# Patient Record
Sex: Male | Born: 1960 | Race: Asian | Hispanic: No | Marital: Married | Smoking: Never smoker
Health system: Southern US, Community
[De-identification: ages and names within clinical notes are randomized; demographics above are authoritative.]

## PROBLEM LIST (undated history)

## (undated) DIAGNOSIS — N200 Calculus of kidney: Secondary | ICD-10-CM

## (undated) DIAGNOSIS — K8020 Calculus of gallbladder without cholecystitis without obstruction: Secondary | ICD-10-CM

## (undated) HISTORY — DX: Calculus of kidney: N20.0

## (undated) HISTORY — DX: Calculus of gallbladder without cholecystitis without obstruction: K80.20

---

## 2008-04-14 ENCOUNTER — Ambulatory Visit: Payer: Self-pay | Admitting: Gastroenterology

## 2008-04-14 DIAGNOSIS — Z87442 Personal history of urinary calculi: Secondary | ICD-10-CM | POA: Insufficient documentation

## 2008-04-14 DIAGNOSIS — Z85038 Personal history of other malignant neoplasm of large intestine: Secondary | ICD-10-CM | POA: Insufficient documentation

## 2008-04-14 DIAGNOSIS — R1011 Right upper quadrant pain: Secondary | ICD-10-CM | POA: Insufficient documentation

## 2008-04-14 DIAGNOSIS — R1013 Epigastric pain: Secondary | ICD-10-CM | POA: Insufficient documentation

## 2008-04-14 LAB — CONVERTED CEMR LAB
Albumin: 4.6 g/dL (ref 3.5–5.2)
Alkaline Phosphatase: 53 units/L (ref 39–117)
BUN: 14 mg/dL (ref 6–23)
CO2: 30 meq/L (ref 19–32)
Eosinophils Relative: 2.5 % (ref 0.0–5.0)
GFR calc Af Amer: 104 mL/min
GFR calc non Af Amer: 86 mL/min
Glucose, Bld: 101 mg/dL — ABNORMAL HIGH (ref 70–99)
H Pylori IgG: NEGATIVE
HCT: 40.4 % (ref 39.0–52.0)
Hemoglobin: 14.5 g/dL (ref 13.0–17.0)
Monocytes Absolute: 0.4 10*3/uL (ref 0.1–1.0)
Monocytes Relative: 7.1 % (ref 3.0–12.0)
Neutro Abs: 3.1 10*3/uL (ref 1.4–7.7)
RBC: 4.64 M/uL (ref 4.22–5.81)
Total Bilirubin: 0.9 mg/dL (ref 0.3–1.2)
WBC: 5.2 10*3/uL (ref 4.5–10.5)

## 2008-04-16 ENCOUNTER — Telehealth (INDEPENDENT_AMBULATORY_CARE_PROVIDER_SITE_OTHER): Payer: Self-pay

## 2008-04-20 ENCOUNTER — Ambulatory Visit (HOSPITAL_COMMUNITY): Admission: RE | Admit: 2008-04-20 | Discharge: 2008-04-20 | Payer: Self-pay | Admitting: Gastroenterology

## 2008-04-20 ENCOUNTER — Telehealth: Payer: Self-pay | Admitting: Internal Medicine

## 2008-04-20 ENCOUNTER — Telehealth (INDEPENDENT_AMBULATORY_CARE_PROVIDER_SITE_OTHER): Payer: Self-pay

## 2008-04-30 ENCOUNTER — Ambulatory Visit (HOSPITAL_COMMUNITY): Admission: RE | Admit: 2008-04-30 | Discharge: 2008-04-30 | Payer: Self-pay | Admitting: Internal Medicine

## 2008-06-06 ENCOUNTER — Emergency Department (HOSPITAL_BASED_OUTPATIENT_CLINIC_OR_DEPARTMENT_OTHER): Admission: EM | Admit: 2008-06-06 | Discharge: 2008-06-06 | Payer: Self-pay | Admitting: Emergency Medicine

## 2008-06-10 DIAGNOSIS — K8020 Calculus of gallbladder without cholecystitis without obstruction: Secondary | ICD-10-CM | POA: Insufficient documentation

## 2008-06-11 ENCOUNTER — Ambulatory Visit: Payer: Self-pay | Admitting: Internal Medicine

## 2009-09-02 ENCOUNTER — Ambulatory Visit (HOSPITAL_COMMUNITY): Admission: RE | Admit: 2009-09-02 | Discharge: 2009-09-02 | Payer: Self-pay | Admitting: Urology

## 2009-09-06 ENCOUNTER — Ambulatory Visit (HOSPITAL_COMMUNITY): Admission: RE | Admit: 2009-09-06 | Discharge: 2009-09-06 | Payer: Self-pay | Admitting: Urology

## 2009-10-16 HISTORY — PX: LITHOTRIPSY: SUR834

## 2012-09-24 ENCOUNTER — Ambulatory Visit (INDEPENDENT_AMBULATORY_CARE_PROVIDER_SITE_OTHER): Payer: Self-pay | Admitting: General Surgery

## 2012-10-02 ENCOUNTER — Encounter (INDEPENDENT_AMBULATORY_CARE_PROVIDER_SITE_OTHER): Payer: Self-pay | Admitting: General Surgery

## 2012-10-07 ENCOUNTER — Ambulatory Visit (INDEPENDENT_AMBULATORY_CARE_PROVIDER_SITE_OTHER): Payer: Private Health Insurance - Indemnity | Admitting: General Surgery

## 2012-10-07 ENCOUNTER — Encounter (INDEPENDENT_AMBULATORY_CARE_PROVIDER_SITE_OTHER): Payer: Self-pay | Admitting: General Surgery

## 2012-10-07 VITALS — BP 128/88 | HR 68 | Temp 97.6°F | Resp 16 | Ht 68.0 in | Wt 173.0 lb

## 2012-10-07 DIAGNOSIS — K8020 Calculus of gallbladder without cholecystitis without obstruction: Secondary | ICD-10-CM

## 2012-10-07 NOTE — Patient Instructions (Signed)
Call if you develop symptoms.

## 2012-10-15 NOTE — Progress Notes (Signed)
Subjective:     Patient ID: Nicholas Massey, male   DOB: [birth year], 51 y.o.   MRN: [id number]  HPI We are asked to see the patient in consultation by Dr. Lenise Arena to evaluate him for gallstones. The patient is a 51 year old [place] gentleman who recently went to [place] and had an ultrasound done. The ultrasound which we do not have the records for he states showed a gallbladder full stones. The only records we can find include a study from several years ago that did show some gallstones. The patient states that he has no abnormal pain. He denies any nausea or vomiting. He appears to be completely asymptomatic at this point. It is not clear why he got an ultrasound in [place].  Review of Systems  Constitutional: Negative.   HENT: Negative.   Eyes: Negative.   Respiratory: Negative.   Cardiovascular: Negative.   Gastrointestinal: Negative.   Genitourinary: Negative.   Musculoskeletal: Negative.   Skin: Negative.   Neurological: Negative.   Hematological: Negative.   Psychiatric/Behavioral: Negative.        Objective:   Physical Exam  Constitutional: He is oriented to person, place, and time. He appears well-developed and well-nourished.  HENT:  Head: Normocephalic and atraumatic.  Eyes: Conjunctivae normal and EOM are normal. Pupils are equal, round, and reactive to light.  Neck: Normal range of motion. Neck supple.  Cardiovascular: Normal rate, regular rhythm and normal heart sounds.   Pulmonary/Chest: Effort normal and breath sounds normal.  Abdominal: Soft. Bowel sounds are normal. He exhibits no mass. There is no tenderness.  Musculoskeletal: Normal range of motion.  Neurological: He is alert and oriented to person, place, and time.  Skin: Skin is warm and dry.  Psychiatric: He has a normal mood and affect. His behavior is normal.       Assessment:     The patient appears to have gallstones but is asymptomatic. I certainly question whether he has had some symptoms since he did get an  ultrasound but he denies any abdominal pain or nausea and vomiting. I have talked to him in detail about the possibility of removing the gallbladder and the risks and benefits associated with it. At this point I think the likelihood of him having a problem from the gallstones is fairly high. He would like to wait until he begins to have symptoms before considering surgery.    Plan:     The patient will call us immediately if he develops any symptoms consistent with biliary colic

## 2016-05-03 ENCOUNTER — Other Ambulatory Visit: Payer: Self-pay | Admitting: Urology

## 2016-05-09 ENCOUNTER — Encounter (HOSPITAL_COMMUNITY): Payer: Self-pay | Admitting: *Deleted

## 2016-05-11 ENCOUNTER — Encounter (HOSPITAL_COMMUNITY)
Admission: RE | Disposition: A | Discharge status: Home / Self Care | Payer: Self-pay | Source: Ambulatory Visit | Attending: Urology

## 2016-05-11 ENCOUNTER — Ambulatory Visit (HOSPITAL_COMMUNITY)
Admission: RE | Admit: 2016-05-11 | Discharge: 2016-05-11 | Disposition: A | Discharge status: Home / Self Care | Payer: 59 | Source: Ambulatory Visit | Attending: Urology | Admitting: Urology

## 2016-05-11 ENCOUNTER — Ambulatory Visit (HOSPITAL_COMMUNITY): Payer: 59

## 2016-05-11 DIAGNOSIS — I10 Essential (primary) hypertension: Secondary | ICD-10-CM | POA: Diagnosis not present

## 2016-05-11 DIAGNOSIS — N132 Hydronephrosis with renal and ureteral calculous obstruction: Secondary | ICD-10-CM | POA: Diagnosis not present

## 2016-05-11 DIAGNOSIS — N201 Calculus of ureter: Secondary | ICD-10-CM

## 2016-05-11 DIAGNOSIS — N200 Calculus of kidney: Secondary | ICD-10-CM | POA: Diagnosis present

## 2016-05-11 SURGERY — LITHOTRIPSY, ESWL
Anesthesia: LOCAL | Laterality: Right

## 2016-05-11 MED ORDER — SODIUM CHLORIDE 0.9 % IV SOLN
INTRAVENOUS | Status: DC
  Administered 2016-05-11: 10:00:00 via INTRAVENOUS

## 2016-05-11 MED ORDER — DIAZEPAM 5 MG PO TABS
10.0000 mg | ORAL_TABLET | ORAL | Status: AC
  Administered 2016-05-11: 10 mg via ORAL
  Filled 2016-05-11: qty 2

## 2016-05-11 MED ORDER — CIPROFLOXACIN HCL 500 MG PO TABS
500.0000 mg | ORAL_TABLET | ORAL | Status: AC
  Administered 2016-05-11: 500 mg via ORAL
  Filled 2016-05-11: qty 1

## 2016-05-11 MED ORDER — DIPHENHYDRAMINE HCL 25 MG PO CAPS
25.0000 mg | ORAL_CAPSULE | ORAL | Status: AC
  Administered 2016-05-11: 25 mg via ORAL
  Filled 2016-05-11: qty 1

## 2016-05-11 NOTE — H&P (Signed)
Office Visit Report     05/01/2016   --------------------------------------------------------------------------------   Nicholas Massey  MRN: [id number]  PRIMARY CARE:  Alanson Aly. Lenise Arena, MD  DOB: 18-May-1961, 55 year old Male  REFERRING:    SSN: [id number]  PROVIDER:  Jethro Bolus, M.D.    LOCATION:  [facility] [phone]   --------------------------------------------------------------------------------   CC: I have kidney stones.  HPI: Nicholas Massey is a 55 year-old male established patient who is here for renal calculi.  He first stated noticing pain on 11/02/2015. This is not his first kidney stone. His first stone was approximately 10/17/2015. He has had 1 stones prior to getting this one. He is not currently having flank pain, back pain, groin pain, nausea, vomiting, fever or chills. He has not caught a stone in his urine strainer since his symptoms began.   He has had eswl for treatment of his stones in the past.   F/u to review CT results.     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 Calculus Ureter, Calculus of right ureter - 11/05/2015 Kidney Stone, Bilateral kidney stones - 11/05/2015, Kidney stone on right side, - 11/05/2015, Kidney stone on left side, - 05/27/2014 Asymptomatic microscopic hematuria, Asymptomatic microscopic hematuria - 09/14/2015 Other microscopic hematuria, Microscopic hematuria - 05/27/2014 Encounter for Prostate Cancer screening, Prostate cancer screening - 2014 Flank Pain, Generalized abdominal pain - 2014 Hematospermia, Hematospermia - 2014 Inflammatory Disease Prostate, Unspec, Prostatitis - 2014 Personal Hx urinary calculi, Nephrolithiasis - 2014    NON-GU PMH: Encounter for general adult medical examination without abnormal findings, Encounter for preventive health examination - 09/14/2015     FAMILY HISTORY: Family Health Status - Father's Age - Runs In Family Family Health Status - Mother's Age - Runs In Family Family Health Status Number - Runs In Family nephrolithiasis - Father   SOCIAL HISTORY: Marital Status: Married Current Smoking Status: Patient has never smoked.  Has never drank.  Patient's occupation Occupational psychologist.    REVIEW OF SYSTEMS:    GU Review Male:   Patient denies penile pain, get up at night to urinate, trouble starting your stream, hard to postpone urination, have to strain to urinate , leakage of urine, stream starts and stops, frequent urination, erection problems, and burning/ pain with urination.  Gastrointestinal (Upper):   Patient denies nausea, vomiting, and indigestion/ heartburn.  Gastrointestinal (Lower):   Patient denies diarrhea and constipation.  Constitutional:   Patient denies fever, night sweats, weight loss, and fatigue.  Skin:   Patient denies skin rash/ lesion and itching.  Eyes:   Patient denies blurred vision and double vision.  Ears/ Nose/ Throat:   Patient denies sore throat and sinus problems.  Hematologic/Lymphatic:   Patient denies swollen glands and easy bruising.  Cardiovascular:   Patient denies leg swelling and chest pains.  Respiratory:   Patient denies cough and shortness of breath.  Endocrine:   Patient denies excessive thirst.  Musculoskeletal:   Patient denies back pain and joint pain.  Neurological:   Patient denies headaches and dizziness.  Psychologic:   Patient denies depression and anxiety.   VITAL SIGNS:      05/01/2016 08:25 AM  BP 160/98 mmHg  Pulse 76 /min  Temperature 97.7 F / 36 C   PAST DATA REVIEWED:  Source Of History:  Patient  X-Ray Review: C.T. Abdomen/Pelvis: Reviewed Report. Discussed With Patient.     05/27/14 05/09/12 08/06/09  PSA  Total PSA 0.39  0.37  0.41     PROCEDURES: None   ASSESSMENT:      ICD-10 Details  1 GU:   Calculus Kidney and Ureter - N20.2   2    Other microscopic hematuria - R31.29   3   Kidney Stone - N20.0   4   Hydronephrosis with renal and ureteral calculous obstruction - N13.2   5   Kidney Stone - N20.0   6   Calculus Ureter - N20.1               Notes:   55 yo male, reporting gross hematuria in January. However, note from NP in January shows that the pt denied hematuria, but he he had NP visit in November showing LLP ( stable) in November , as well as Right-sided Lower pole stone, and 5mm Right mid-ureter stone, with gross hematuria. He elected to be followed, and has remained asymptomatic, but his CT now shows that the stone has grown to 9mm, and is in the Right lower ureter, with mild hydronephrosis. Even though he remains asymptomatic, I think he should have removal of the stone, because of it's increase in size, and because of its failure to progress to pass. He has mild hydronephrosis also. We have disussed his options for stone removal, including both lithotripsy and cysto with laser fractionation of the stone. He has had successful lithotripsy before, and sould like to proceed with lithotripsy again, understanding that he can have cysto and laser of stone as a 2ndary surgery if necessary. We have discussed possible complications of both surgical approaches, including bleeding and strictures; as well as my concerns of leaving a large stone in the lower ureter to " grow into" the wall of the ureter with permanent hydronephrosis.  He needs Litholink in the future.     PLAN:           Schedule Return Visit: Next Available Appointment - Schedule Surgery          Document Letter(s):  Created for Patient: Clinical Summary    Signed by Jethro Bolus, M.D. on 05/01/16 at 9:03 AM (EDT)     The information contained in this medical record document is considered private and confidential patient information. This information can only be used for the medical diagnosis and/or medical services that are being provided by the  patient's selected caregivers. This information can only be distributed outside of the patient's care if the patient agrees and signs waivers of authorization for this information to be sent to an outside source or route.

## 2016-05-11 NOTE — Discharge Instructions (Addendum)
Moderate Conscious Sedation, Adult, Care After °Refer to this sheet in the next few weeks. These instructions provide you with information on caring for yourself after your procedure. Your health care provider may also give you more specific instructions. Your treatment has been planned according to current medical practices, but problems sometimes occur. Call your health care provider if you have any problems or questions after your procedure. °WHAT TO EXPECT AFTER THE PROCEDURE  °After your procedure: °You may feel sleepy, clumsy, and have poor balance for several hours. °Vomiting may occur if you eat too soon after the procedure. °HOME CARE INSTRUCTIONS °Do not participate in any activities where you could become injured for at least 24 hours. Do not: °Drive. °Swim. °Ride a bicycle. °Operate heavy machinery. °Cook. °Use power tools. °Climb ladders. °Work from a high place. °Do not make important decisions or sign legal documents until you are improved. °If you vomit, drink water, juice, or soup when you can drink without vomiting. Make sure you have little or no nausea before eating solid foods. °Only take over-the-counter or prescription medicines for pain, discomfort, or fever as directed by your health care provider. °Make sure you and your family fully understand everything about the medicines given to you, including what side effects may occur. °You should not drink alcohol, take sleeping pills, or take medicines that cause drowsiness for at least 24 hours. °If you smoke, do not smoke without supervision. °If you are feeling better, you may resume normal activities 24 hours after you were sedated. °Keep all appointments with your health care provider. °SEEK MEDICAL CARE IF: °Your skin is pale or bluish in color. °You continue to feel nauseous or vomit. °Your pain is getting worse and is not helped by medicine. °You have bleeding or swelling. °You are still sleepy or feeling clumsy after 24 hours. °SEEK  IMMEDIATE MEDICAL CARE IF: °You develop a rash. °You have difficulty breathing. °You develop any type of allergic problem. °You have a fever. °MAKE SURE YOU: °Understand these instructions. °Will watch your condition. °Will get help right away if you are not doing well or get worse. °  °This information is not intended to replace advice given to you by your health care provider. Make sure you discuss any questions you have with your health care provider. °  °Document Released: 07/23/2013 Document Revised: 10/23/2014 Document Reviewed: 07/23/2013 °Elsevier Interactive Patient Education ©2016 [employer] °Dietary Guidelines to Help Prevent Kidney Stones °Your risk of kidney stones can be decreased by adjusting the foods you eat. The most important thing you can do is drink enough fluid. You should drink enough fluid to keep your urine clear or pale yellow. The following guidelines provide specific information for the type of kidney stone you have had. °GUIDELINES ACCORDING TO TYPE OF KIDNEY STONE °Calcium Oxalate Kidney Stones °· Reduce the amount of salt you eat. Foods that have a lot of salt cause your body to release excess calcium into your urine. The excess calcium can combine with a substance called oxalate to form kidney stones. °· Reduce the amount of animal protein you eat if the amount you eat is excessive. Animal protein causes your body to release excess calcium into your urine. Ask your dietitian how much protein from animal sources you should be eating. °· Avoid foods that are high in oxalates. If you take vitamins, they should have less than 500 mg of vitamin C. Your body turns vitamin C into oxalates. You do not need to avoid fruits   and vegetables high in vitamin C. °Calcium Phosphate Kidney Stones °· Reduce the amount of salt you eat to help prevent the release of excess calcium into your urine. °· Reduce the amount of animal protein you eat if the amount you eat is excessive. Animal protein causes  your body to release excess calcium into your urine. Ask your dietitian how much protein from animal sources you should be eating. °· Get enough calcium from food or take a calcium supplement (ask your dietitian for recommendations). Food sources of calcium that do not increase your risk of kidney stones include: °¨ Broccoli. °¨ Dairy products, such as cheese and yogurt. °¨ Pudding. °Uric Acid Kidney Stones °· Do not have more than 6 oz of animal protein per day. °FOOD SOURCES °Animal Protein Sources °· Meat (all types). °· Poultry. °· Eggs. °· Fish, seafood. °Foods High in Salt °· Salt seasonings. °· Soy sauce. °· Teriyaki sauce. °· Cured and processed meats. °· Salted crackers and snack foods. °· Fast food. °· Canned soups and most canned foods. °Foods High in Oxalates °· Grains: °¨ Amaranth. °¨ Barley. °¨ Grits. °¨ Wheat germ. °¨ Bran. °¨ Buckwheat flour. °¨ All bran cereals. °¨ Pretzels. °¨ Whole wheat bread. °· Vegetables: °¨ Beans (wax). °¨ Beets and beet greens. °¨ Collard greens. °¨ Eggplant. °¨ Escarole. °¨ Leeks. °¨ Okra. °¨ Parsley. °¨ Rutabagas. °¨ Spinach. °¨ Swiss chard. °¨ Tomato paste. °¨ Fried potatoes. °¨ Sweet potatoes. °· Fruits: °¨ Red currants. °¨ Figs. °¨ Kiwi. °¨ Rhubarb. °· Meat and Other Protein Sources: °¨ Beans (dried). °¨ Soy burgers and other soybean products. °¨ Miso. °¨ Nuts (peanuts, almonds, pecans, cashews, hazelnuts). °¨ Nut butters. °¨ Sesame seeds and tahini (paste made of sesame seeds). °¨ Poppy seeds. °· Beverages: °¨ Chocolate drink mixes. °¨ Soy milk. °¨ Instant iced tea. °¨ Juices made from high-oxalate fruits or vegetables. °· Other: °¨ Carob. °¨ Chocolate. °¨ Fruitcake. °¨ Marmalades. °  °This information is not intended to replace advice given to you by your health care provider. Make sure you discuss any questions you have with your health care provider. °  °Document Released: 01/27/2011 Document Revised: 10/07/2013 Document Reviewed: 08/29/2013 °Elsevier Interactive  Patient Education ©2016 Else[employer]. ° °

## 2016-05-11 NOTE — Interval H&P Note (Signed)
History and Physical Interval Note:  05/11/2016 10:05 AM  Nicholas Massey  has presented today for surgery, with the diagnosis of RIGHT URETERAL STONE  The various methods of treatment have been discussed with the patient and family. After consideration of risks, benefits and other options for treatment, the patient has consented to  Procedure(s): RIGHT EXTRACORPOREAL SHOCK WAVE LITHOTRIPSY (ESWL) (Right) as a surgical intervention .  The patient's history has been reviewed, patient examined, no change in status, stable for surgery.  I have reviewed the patient's chart and labs.  Questions were answered to the patient's satisfaction.     Lynnelle Mesmer I Behr Cislo

## 2017-01-19 IMAGING — CR DG ABDOMEN 1V
2 series · 2 of 2 positions shown · non-contrast
Comparison: CT 04/28/2016.

CLINICAL DATA: Lithotripsy evaluation for right-sided stone.

EXAM:
ABDOMEN - 1 VIEW

[t abdomen supine (1 of 2)]
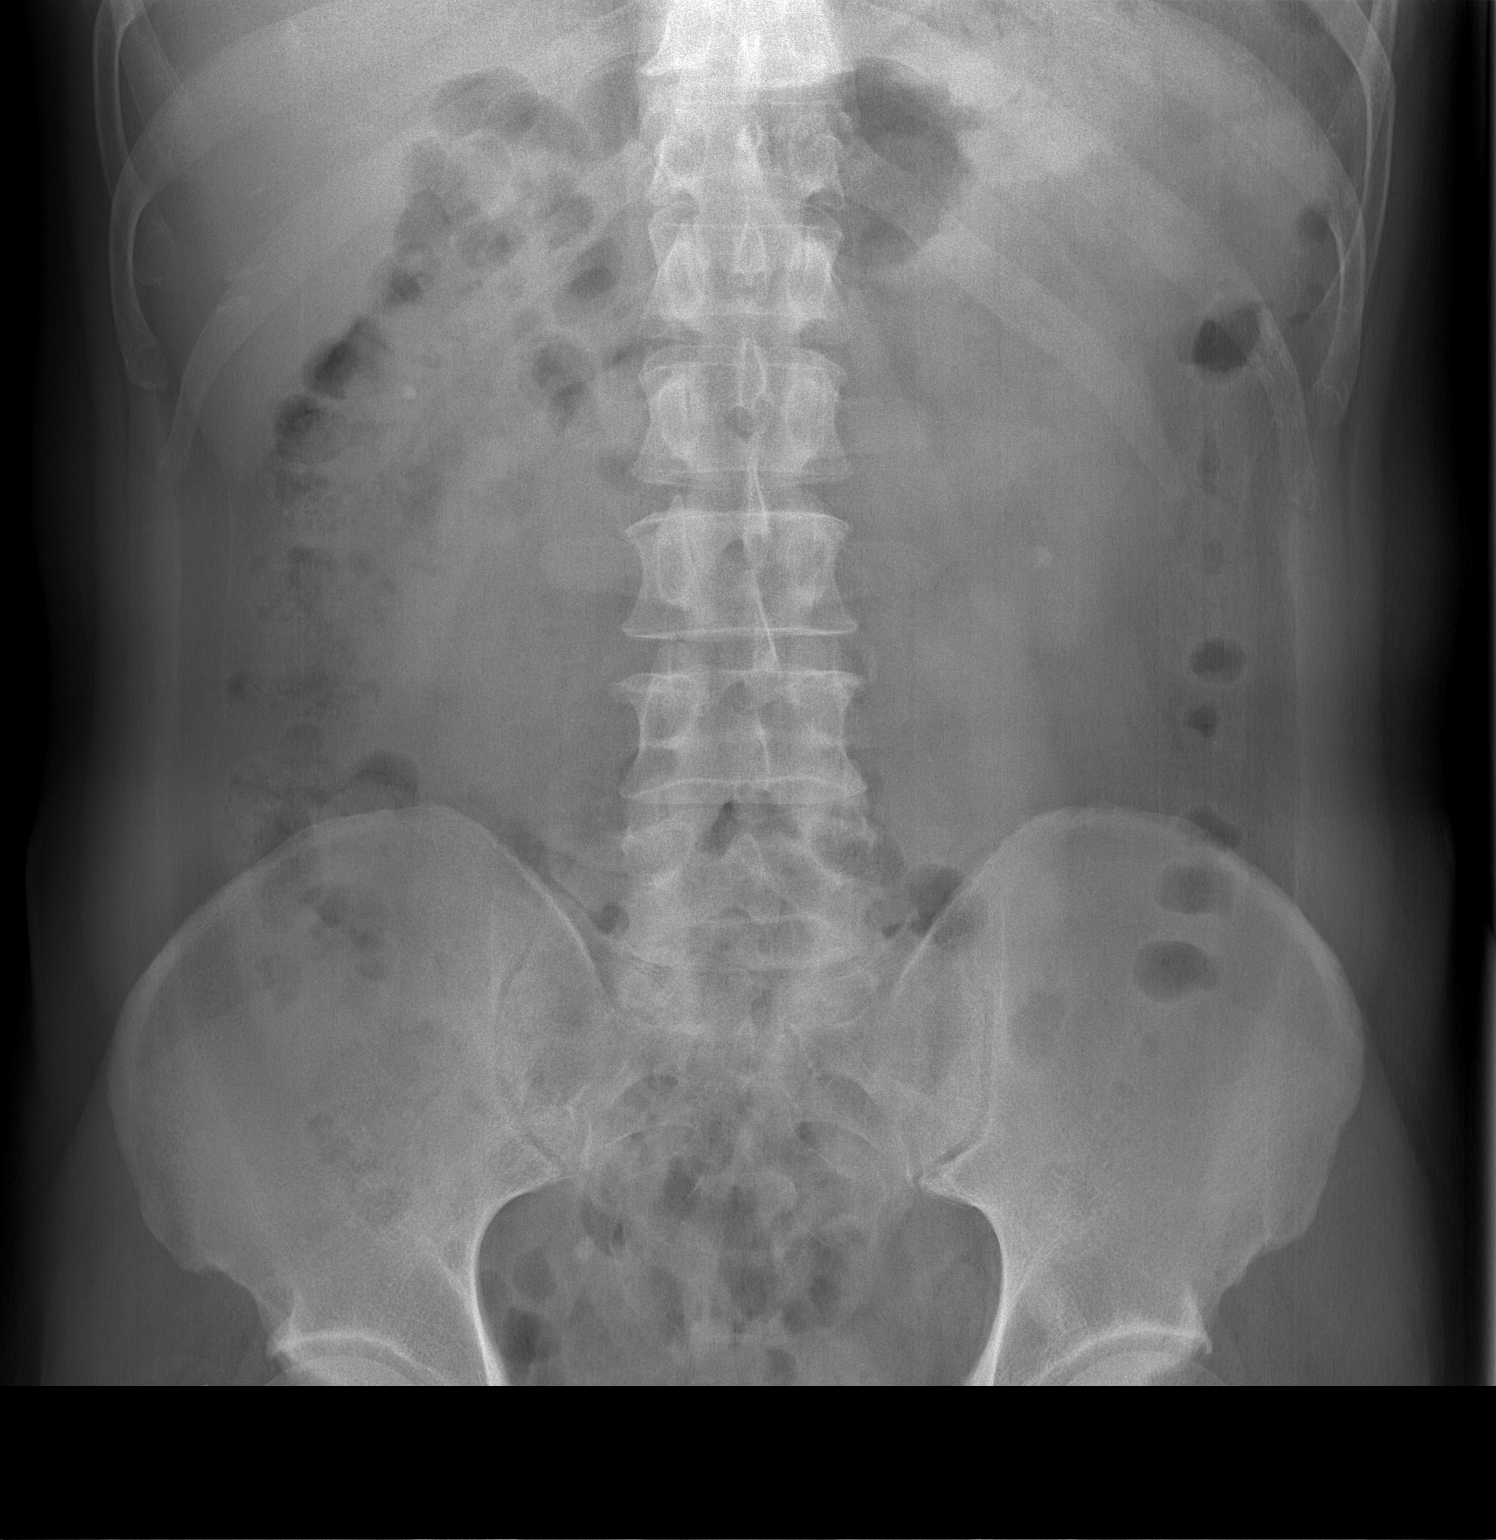

[t abdomen supine (2 of 2)]
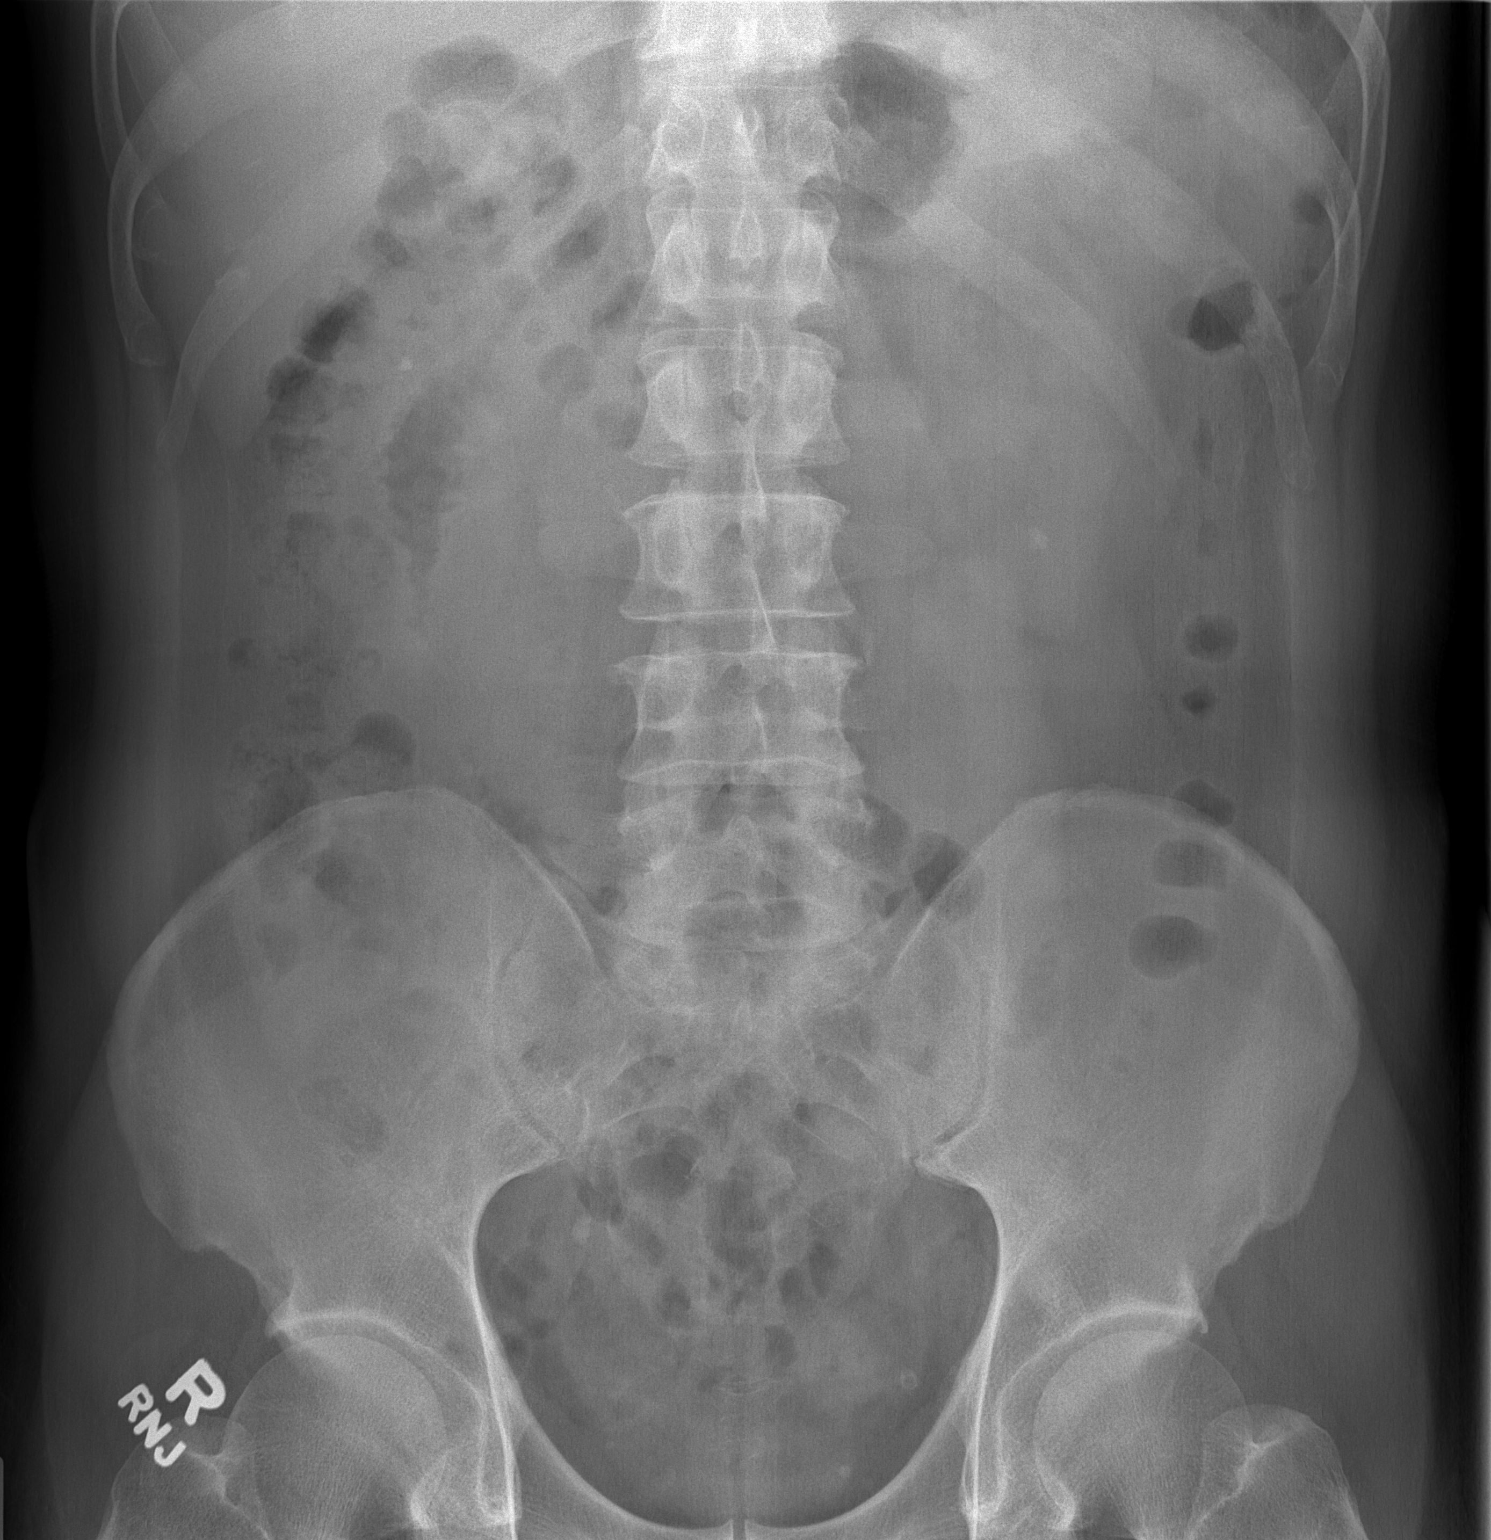

[2 of 2 positions shown; findings below may reference images not displayed]

FINDINGS: 4 mm stone remains evident overlying the midportion of the right
kidney. 6 mm stone remains evident overlying the lower pole of the
left kidney. 8 mm stone remains evident in the distal right ureter
at the mid pelvic level. Phleboliths are present in the pelvis.
IMPRESSION: 8 mm stone remains present within the right pelvic ureter.

4 mm stone midportion right kidney.

6 mm stone lower pole left kidney.
